# Patient Record
Sex: Female | Born: 1960 | Race: Black or African American | Hispanic: No | Marital: Single
Health system: Southern US, Community
[De-identification: ages and names within clinical notes are randomized; demographics above are authoritative.]

---

## 2005-04-01 ENCOUNTER — Emergency Department: Payer: Self-pay | Admitting: Emergency Medicine

## 2005-06-23 ENCOUNTER — Emergency Department: Payer: Self-pay | Admitting: Emergency Medicine

## 2005-09-06 ENCOUNTER — Emergency Department: Payer: Self-pay | Admitting: Emergency Medicine

## 2005-09-07 ENCOUNTER — Ambulatory Visit: Payer: Self-pay | Admitting: Emergency Medicine

## 2005-09-09 ENCOUNTER — Ambulatory Visit: Payer: Self-pay | Admitting: Internal Medicine

## 2005-10-17 ENCOUNTER — Other Ambulatory Visit: Payer: Self-pay

## 2005-10-17 ENCOUNTER — Emergency Department: Payer: Self-pay | Admitting: General Practice

## 2006-06-01 ENCOUNTER — Emergency Department: Payer: Self-pay | Admitting: Emergency Medicine

## 2007-03-20 ENCOUNTER — Emergency Department: Payer: Self-pay | Admitting: Emergency Medicine

## 2008-01-10 ENCOUNTER — Emergency Department: Payer: Self-pay | Admitting: Emergency Medicine

## 2008-01-11 ENCOUNTER — Emergency Department: Payer: Self-pay

## 2008-01-12 ENCOUNTER — Emergency Department: Payer: Self-pay | Admitting: Emergency Medicine

## 2008-01-13 ENCOUNTER — Emergency Department: Payer: Self-pay | Admitting: Emergency Medicine

## 2008-03-04 ENCOUNTER — Emergency Department: Payer: Self-pay | Admitting: Emergency Medicine

## 2008-03-18 ENCOUNTER — Inpatient Hospital Stay: Payer: Self-pay | Admitting: Internal Medicine

## 2008-03-18 IMAGING — CR DG HUMERUS 2V *R*
1 series · 3 of 3 positions shown · non-contrast
Comparison: none

REASON FOR EXAM: Pain
COMMENTS:

PROCEDURE:     DXR - DXR HUMERUS RIGHT  - [DATE] [DATE]
RESULT:     There is no evidence of fracture, dislocation or malalignment.
There is no evidence of cortical disruption or subcutaneous emphysema to
suggest the sequelae of osteomyelitis.

[Series 1: view not recorded · 0.17mm/px · 3 of 3 slices shown]
[im 1/3]
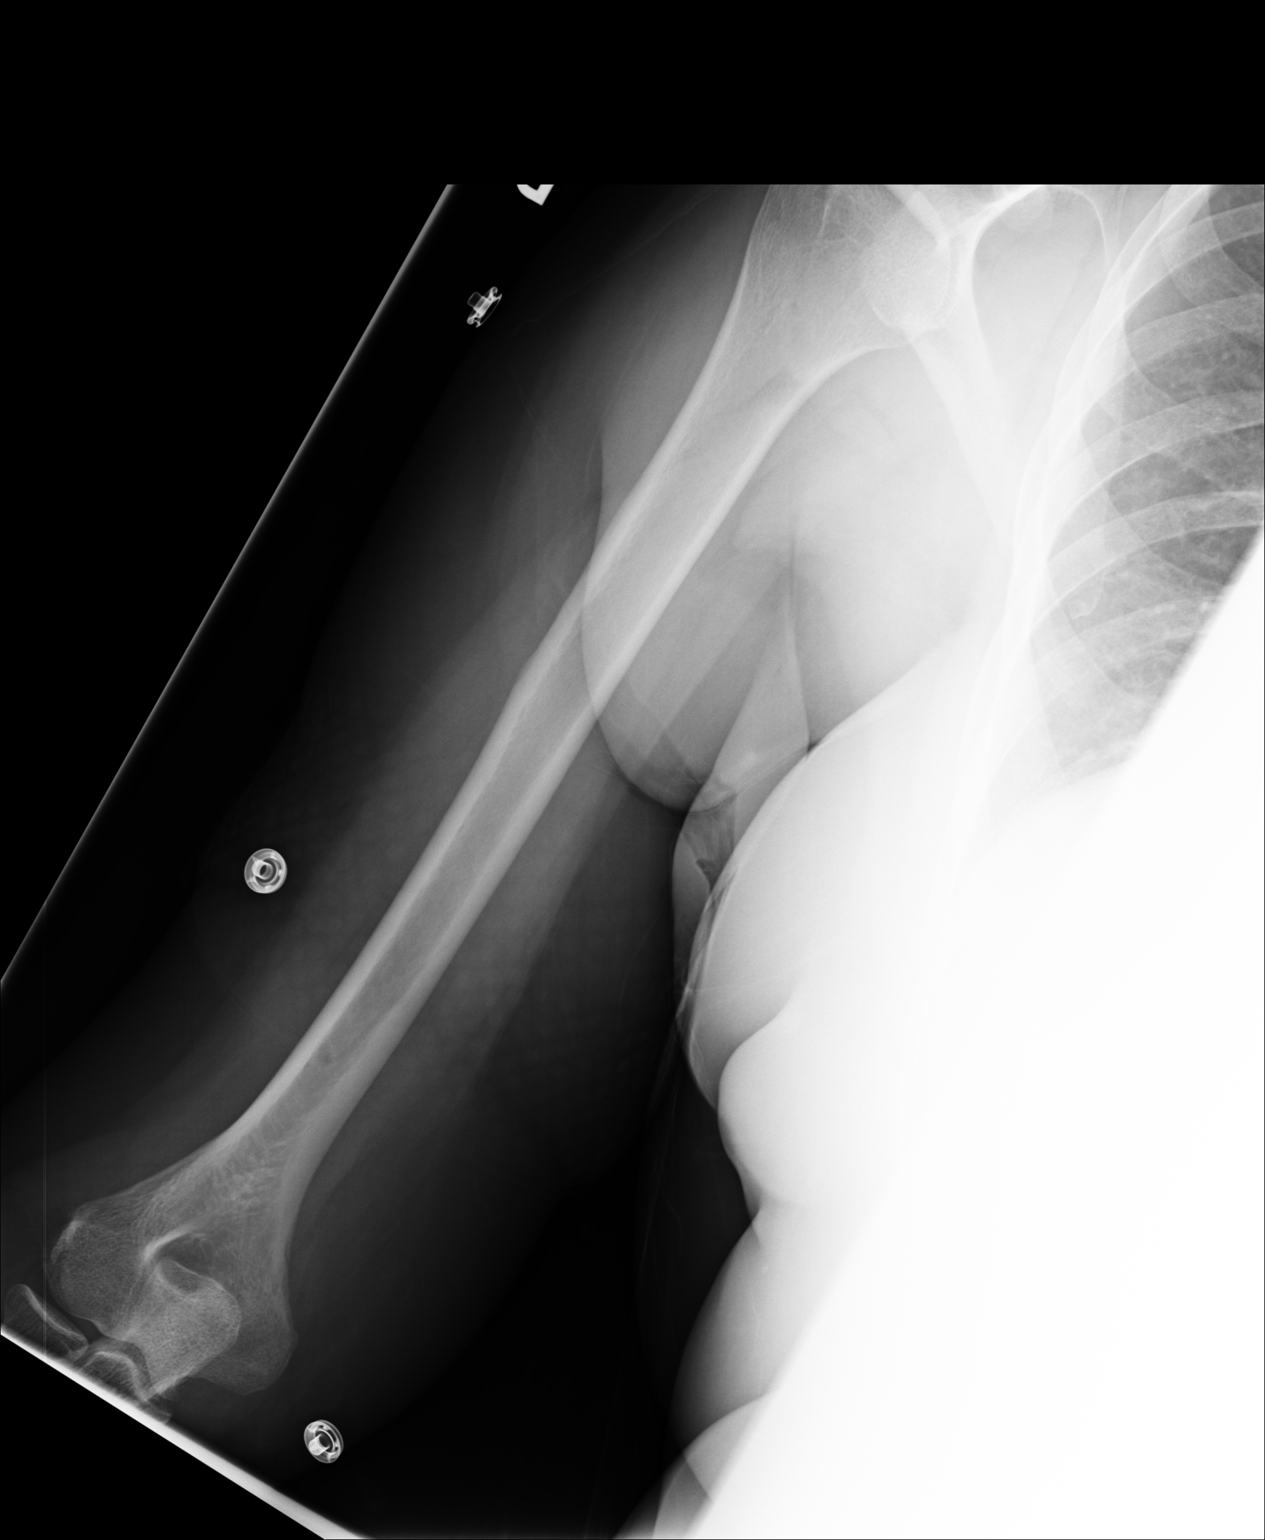
[im 2/3]
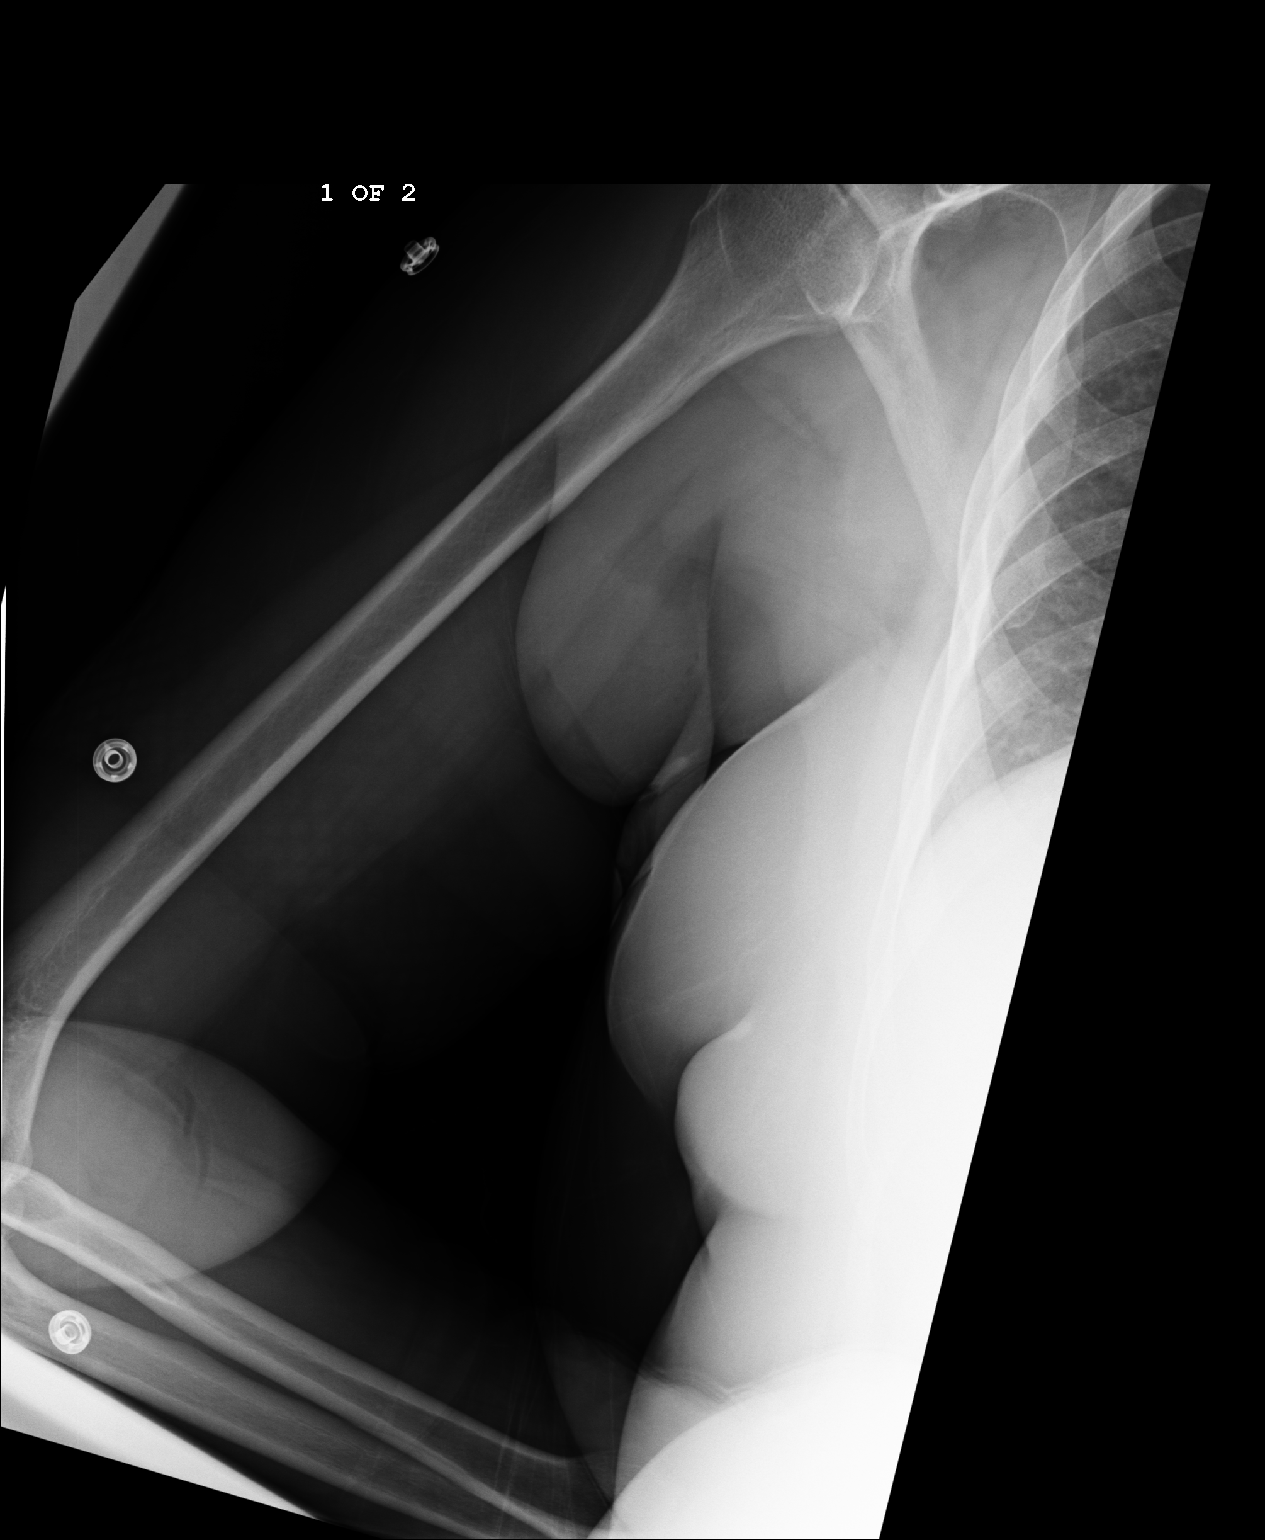
[im 3/3]
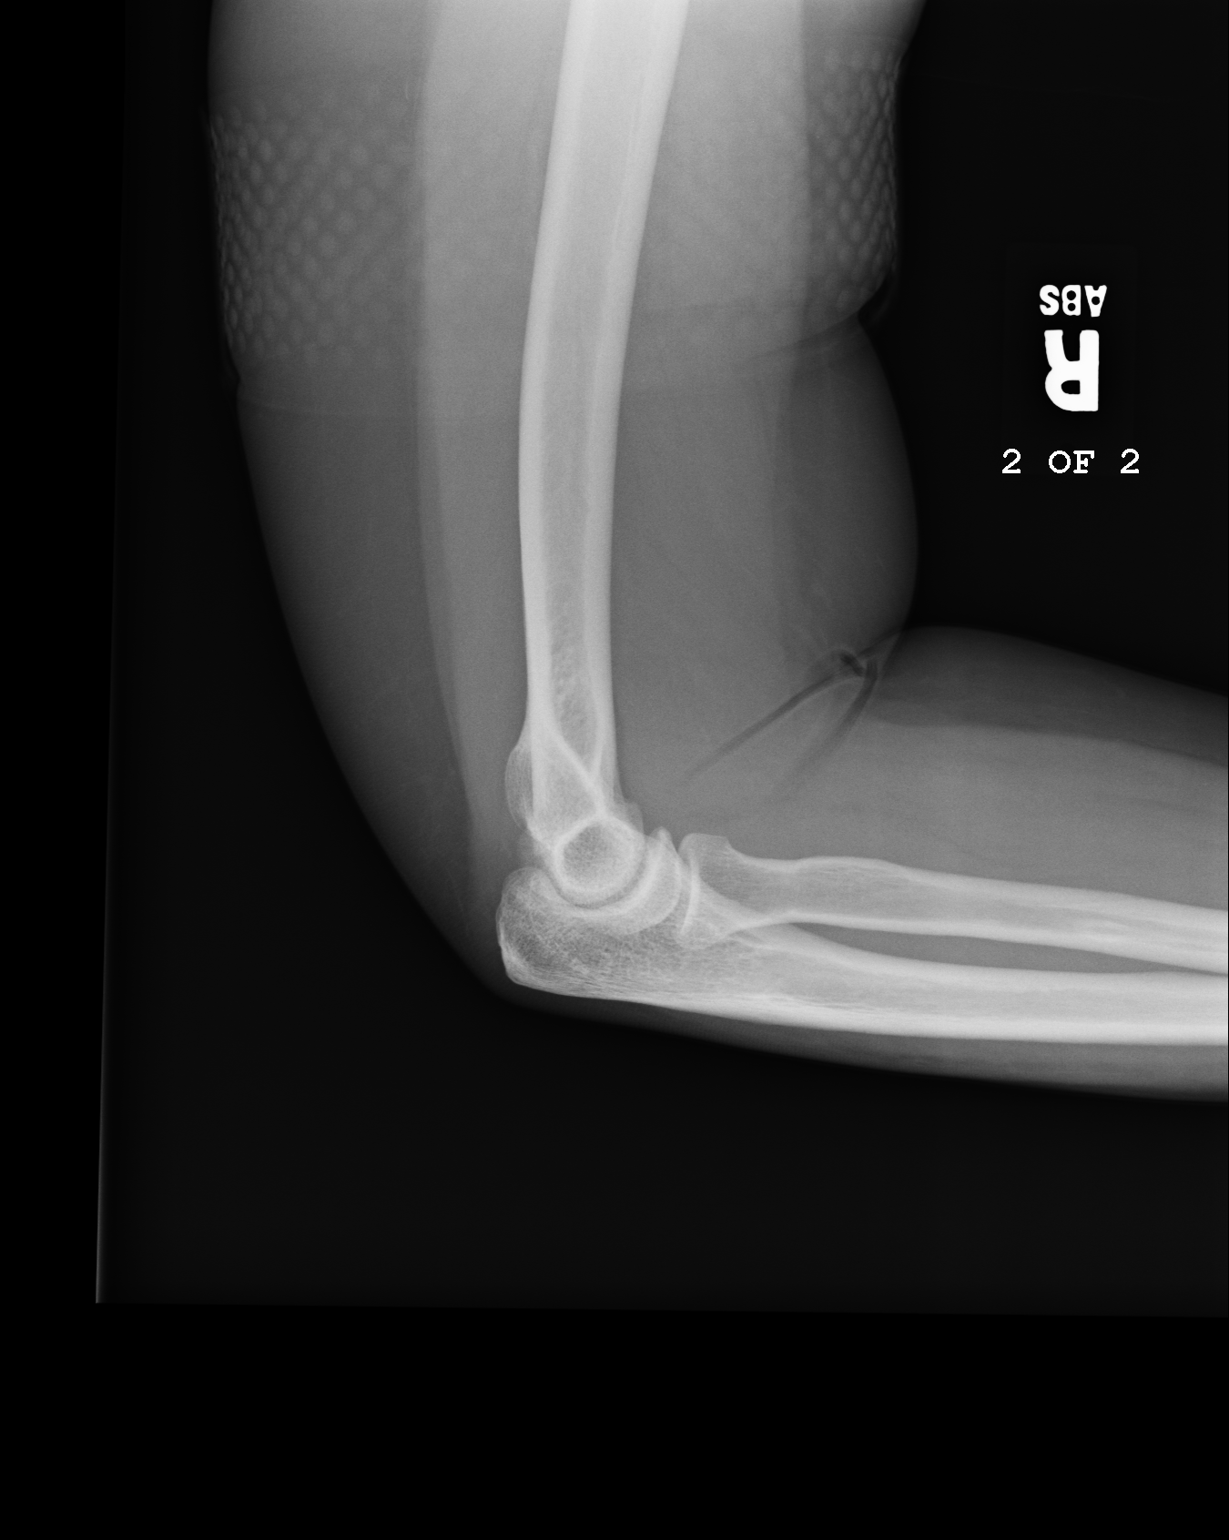

[3 of 3 positions shown; findings below may reference images not displayed]

IMPRESSION: No evidence of focal or acute osseous abnormalities. If
there is persistent clinical concern, repeat evaluation in 7-10 days is
recommended, if clinically warranted.

## 2009-06-22 ENCOUNTER — Ambulatory Visit: Payer: Self-pay | Admitting: Family

## 2009-07-25 ENCOUNTER — Encounter: Payer: Self-pay | Admitting: Orthopedic Surgery

## 2009-08-11 ENCOUNTER — Ambulatory Visit: Payer: Self-pay | Admitting: Pain Medicine

## 2009-08-21 ENCOUNTER — Encounter: Payer: Self-pay | Admitting: Orthopedic Surgery

## 2009-08-22 ENCOUNTER — Ambulatory Visit: Payer: Self-pay | Admitting: Pain Medicine

## 2009-09-15 ENCOUNTER — Ambulatory Visit: Payer: Self-pay | Admitting: Pain Medicine

## 2009-09-21 ENCOUNTER — Ambulatory Visit: Payer: Self-pay | Admitting: Pain Medicine

## 2009-10-20 ENCOUNTER — Ambulatory Visit: Payer: Self-pay | Admitting: Pain Medicine

## 2009-11-28 ENCOUNTER — Ambulatory Visit: Payer: Self-pay | Admitting: Pain Medicine

## 2009-12-15 ENCOUNTER — Ambulatory Visit: Payer: Self-pay | Admitting: Pain Medicine

## 2010-01-16 ENCOUNTER — Ambulatory Visit: Payer: Self-pay | Admitting: Pain Medicine

## 2010-02-16 ENCOUNTER — Ambulatory Visit: Payer: Self-pay | Admitting: Pain Medicine

## 2010-02-20 ENCOUNTER — Ambulatory Visit: Payer: Self-pay | Admitting: Pain Medicine

## 2010-02-25 ENCOUNTER — Emergency Department: Payer: Self-pay | Admitting: Emergency Medicine

## 2010-03-14 ENCOUNTER — Ambulatory Visit: Payer: Self-pay | Admitting: Pain Medicine

## 2010-04-13 ENCOUNTER — Emergency Department: Payer: Self-pay | Admitting: Emergency Medicine

## 2010-04-13 ENCOUNTER — Ambulatory Visit: Payer: Self-pay | Admitting: Pain Medicine

## 2010-06-27 ENCOUNTER — Ambulatory Visit: Payer: Self-pay | Admitting: Family

## 2010-07-13 ENCOUNTER — Ambulatory Visit: Payer: Self-pay | Admitting: Family

## 2011-01-11 ENCOUNTER — Emergency Department: Payer: Self-pay | Admitting: Emergency Medicine

## 2011-01-13 ENCOUNTER — Emergency Department: Payer: Self-pay | Admitting: Emergency Medicine

## 2011-01-15 ENCOUNTER — Emergency Department: Payer: Self-pay | Admitting: Emergency Medicine

## 2011-01-21 ENCOUNTER — Emergency Department: Payer: Self-pay | Admitting: *Deleted

## 2011-02-19 IMAGING — CR DG LUMBAR SPINE 2-3V
1 series · 3 of 3 positions shown · non-contrast
Comparison: none

REASON FOR EXAM: low back pain numbness to left leg
COMMENTS:

[Series 1: view not recorded · 0.17mm/px · 3 of 3 slices shown]
[im 1/3]
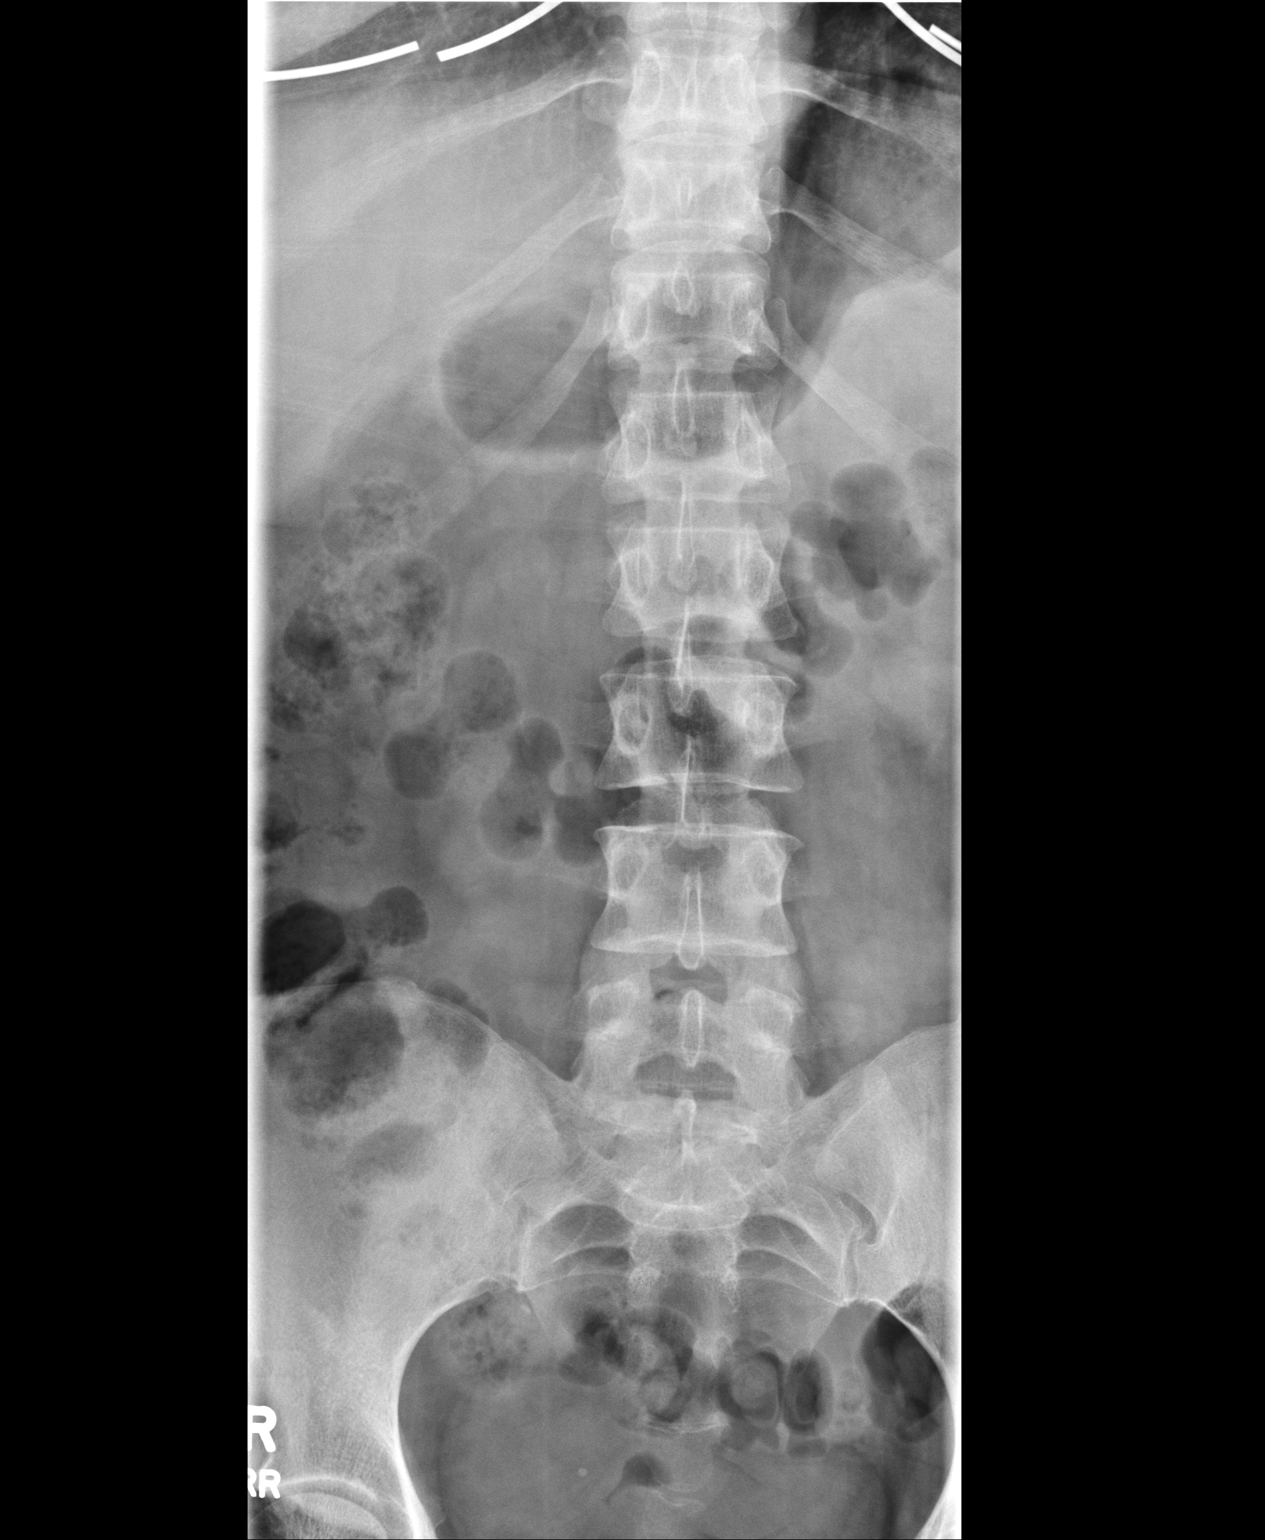
[im 2/3]
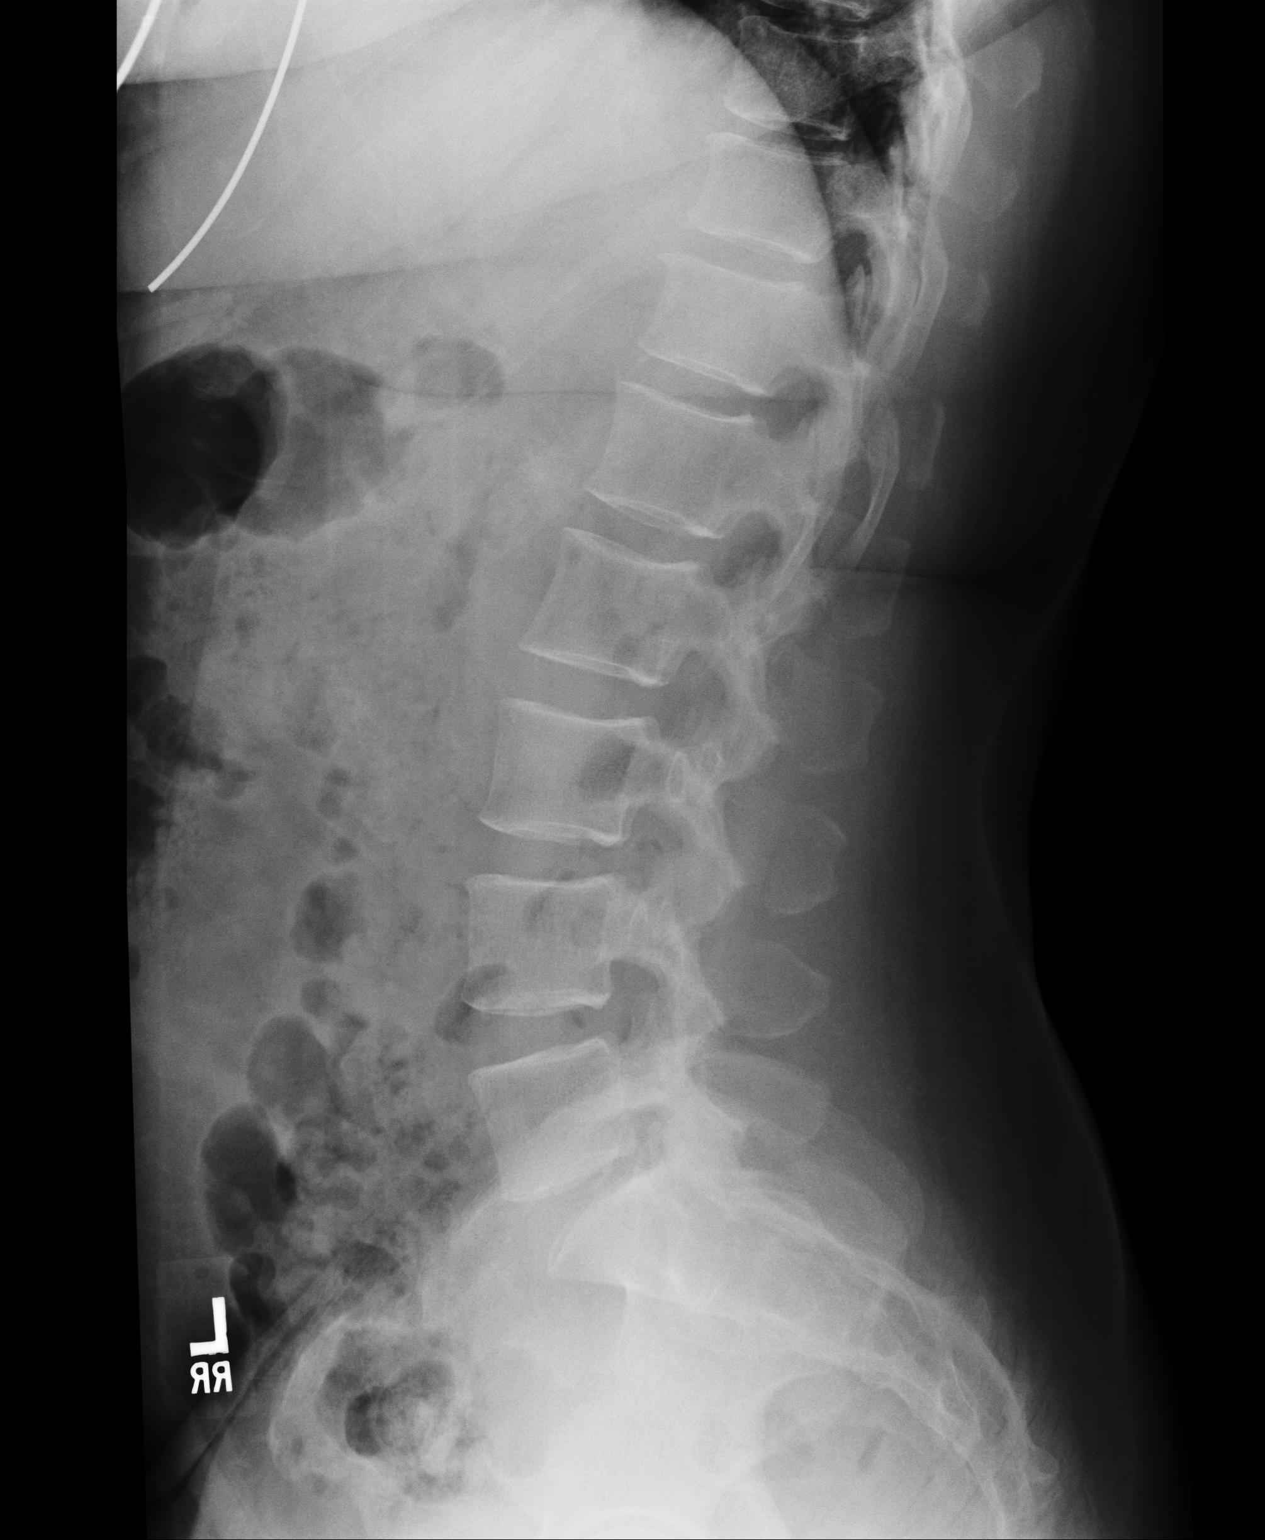
[im 3/3]
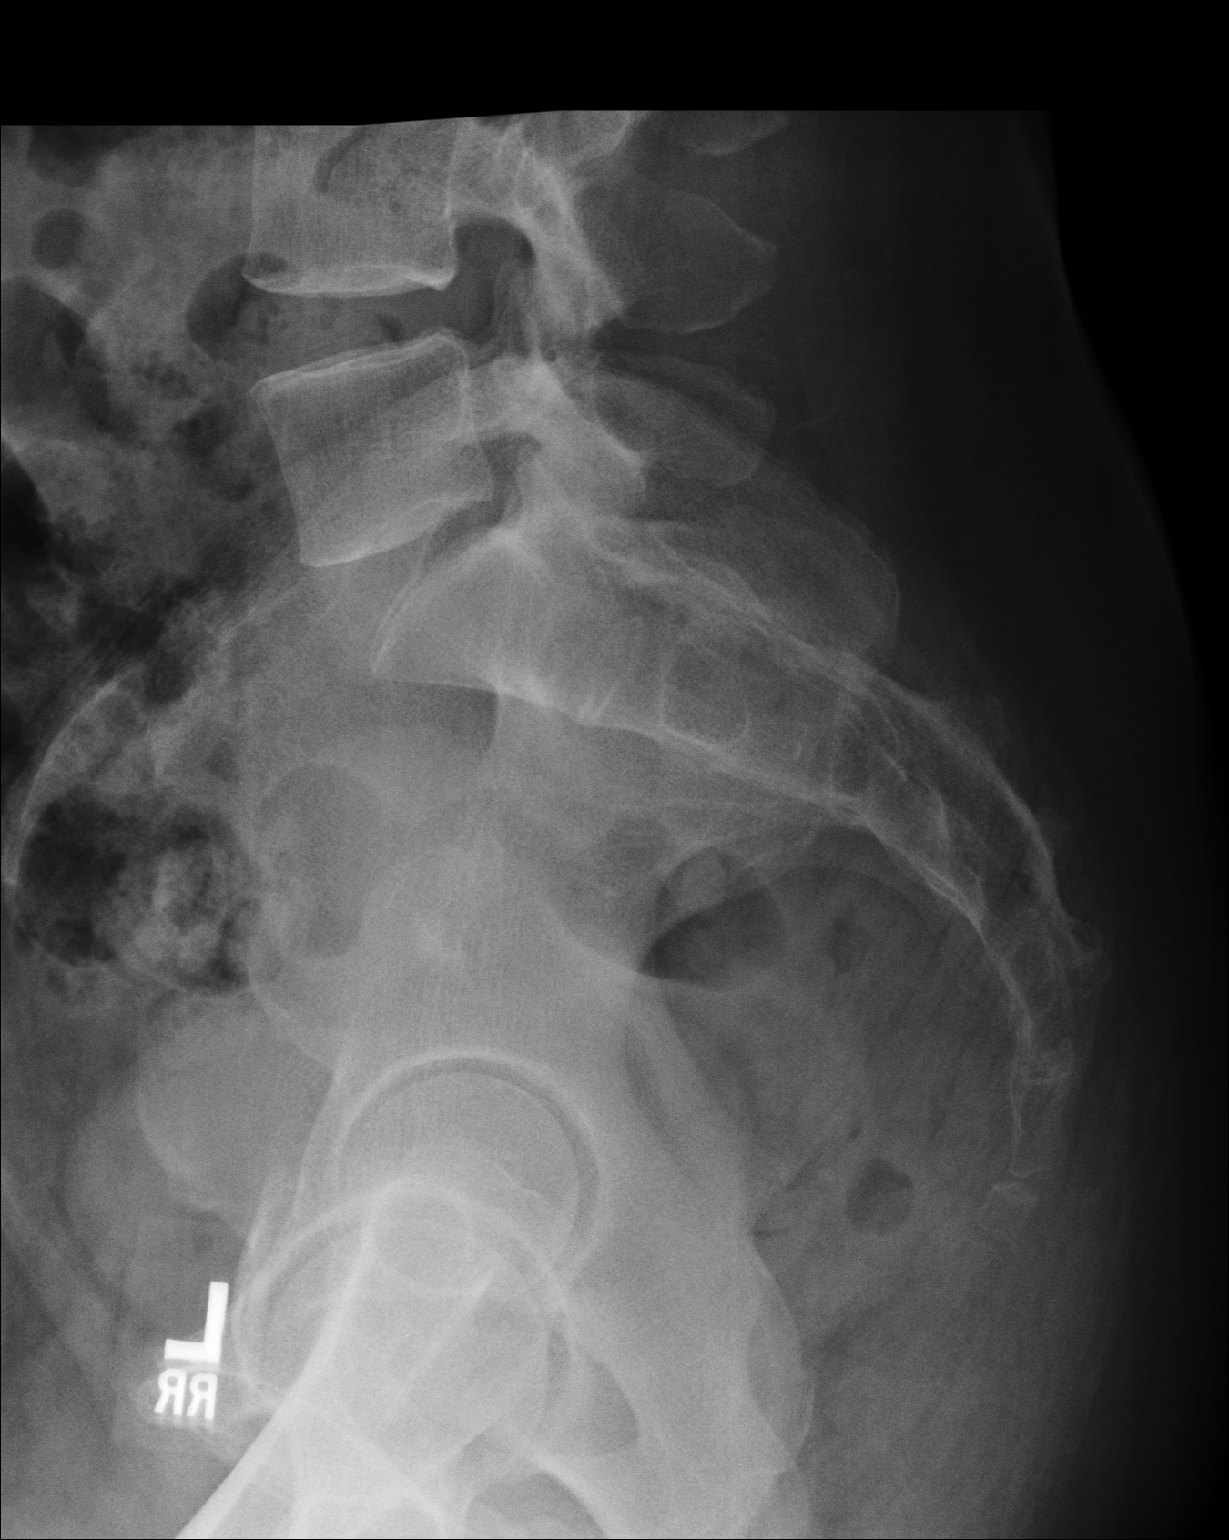

[3 of 3 positions shown; findings below may reference images not displayed]

PROCEDURE:     DXR - DXR LUMBAR SPINE AP AND LATERAL  - February 25, 2010  [DATE]

RESULT:     The lumbar vertebral bodies are preserved in height. The
intravertebral disc space heights are well-maintained. The spinous processes
appear intact. The pedicles and transverse processes are normal in
appearance.
IMPRESSION: I do not see evidence of disc space narrowing or other
degenerative change of the lumbar spine. There is no evidence of a
compression fracture.

## 2022-11-16 ENCOUNTER — Other Ambulatory Visit: Payer: Self-pay

## 2022-11-16 ENCOUNTER — Emergency Department
Admission: EM | Admit: 2022-11-16 | Discharge: 2022-11-16 | Disposition: A | Discharge status: Home / Self Care | Payer: Medicare Other | Attending: Student in an Organized Health Care Education/Training Program | Admitting: Student in an Organized Health Care Education/Training Program

## 2022-11-16 ENCOUNTER — Emergency Department: Payer: Medicare Other

## 2022-11-16 DIAGNOSIS — J45909 Unspecified asthma, uncomplicated: Secondary | ICD-10-CM | POA: Diagnosis not present

## 2022-11-16 DIAGNOSIS — I10 Essential (primary) hypertension: Secondary | ICD-10-CM | POA: Insufficient documentation

## 2022-11-16 DIAGNOSIS — E119 Type 2 diabetes mellitus without complications: Secondary | ICD-10-CM | POA: Diagnosis not present

## 2022-11-16 DIAGNOSIS — D72829 Elevated white blood cell count, unspecified: Secondary | ICD-10-CM | POA: Insufficient documentation

## 2022-11-16 DIAGNOSIS — Z1152 Encounter for screening for COVID-19: Secondary | ICD-10-CM | POA: Diagnosis not present

## 2022-11-16 DIAGNOSIS — R112 Nausea with vomiting, unspecified: Secondary | ICD-10-CM | POA: Diagnosis present

## 2022-11-16 DIAGNOSIS — R197 Diarrhea, unspecified: Secondary | ICD-10-CM | POA: Diagnosis not present

## 2022-11-16 DIAGNOSIS — R1084 Generalized abdominal pain: Secondary | ICD-10-CM

## 2022-11-16 DIAGNOSIS — R109 Unspecified abdominal pain: Secondary | ICD-10-CM | POA: Diagnosis not present

## 2022-11-16 LAB — URINALYSIS, W/ REFLEX TO CULTURE (INFECTION SUSPECTED)
Bilirubin Urine: NEGATIVE
Glucose, UA: NEGATIVE mg/dL
Hgb urine dipstick: NEGATIVE
Ketones, ur: 5 mg/dL — AB
Leukocytes,Ua: NEGATIVE
Nitrite: NEGATIVE
Protein, ur: NEGATIVE mg/dL
Specific Gravity, Urine: >1.046 — ABNORMAL HIGH (ref 1.005–1.030)
pH: 6 (ref 5.0–8.0)

## 2022-11-16 LAB — COMPREHENSIVE METABOLIC PANEL
ALT: 29 U/L (ref 0–44)
AST: 26 U/L (ref 15–41)
Albumin: 4.3 g/dL (ref 3.5–5.0)
Alkaline Phosphatase: 92 U/L (ref 38–126)
Anion gap: 13 (ref 5–15)
BUN: 12 mg/dL (ref 8–23)
CO2: 25 mmol/L (ref 22–32)
Calcium: 10.5 mg/dL — ABNORMAL HIGH (ref 8.9–10.3)
Chloride: 103 mmol/L (ref 98–111)
Creatinine, Ser: 0.86 mg/dL (ref 0.44–1.00)
GFR, Estimated: >60 mL/min (ref >60)
Glucose, Bld: 99 mg/dL (ref 70–99)
Potassium: 3.8 mmol/L (ref 3.5–5.1)
Sodium: 141 mmol/L (ref 135–145)
Total Bilirubin: 0.2 mg/dL — ABNORMAL LOW (ref 0.3–1.2)
Total Protein: 8.4 g/dL — ABNORMAL HIGH (ref 6.5–8.1)

## 2022-11-16 LAB — CBC WITH DIFFERENTIAL/PLATELET
Abs Immature Granulocytes: 0.03 10*3/uL (ref 0.00–0.07)
Basophils Absolute: 0.1 10*3/uL (ref 0.0–0.1)
Basophils Relative: 1 %
Eosinophils Absolute: 0.2 10*3/uL (ref 0.0–0.5)
Eosinophils Relative: 1 %
HCT: 39.2 % (ref 36.0–46.0)
Hemoglobin: 13.7 g/dL (ref 12.0–15.0)
Immature Granulocytes: 0 %
Lymphocytes Relative: 28 %
Lymphs Abs: 3.5 10*3/uL (ref 0.7–4.0)
MCH: 31.4 pg (ref 26.0–34.0)
MCHC: 34.9 g/dL (ref 30.0–36.0)
MCV: 89.9 fL (ref 80.0–100.0)
Monocytes Absolute: 0.7 10*3/uL (ref 0.1–1.0)
Monocytes Relative: 6 %
Neutro Abs: 7.8 10*3/uL — ABNORMAL HIGH (ref 1.7–7.7)
Neutrophils Relative %: 64 %
Platelets: 420 10*3/uL — ABNORMAL HIGH (ref 150–400)
RBC: 4.36 MIL/uL (ref 3.87–5.11)
RDW: 13 % (ref 11.5–15.5)
WBC: 12.4 10*3/uL — ABNORMAL HIGH (ref 4.0–10.5)
nRBC: 0 % (ref 0.0–0.2)

## 2022-11-16 LAB — RESP PANEL BY RT-PCR (RSV, FLU A&B, COVID)  RVPGX2
Influenza A by PCR: NEGATIVE
Influenza B by PCR: NEGATIVE
Resp Syncytial Virus by PCR: NEGATIVE
SARS Coronavirus 2 by RT PCR: NEGATIVE

## 2022-11-16 LAB — LACTIC ACID, PLASMA: Lactic Acid, Venous: 1.3 mmol/L (ref 0.5–1.9)

## 2022-11-16 LAB — LIPASE, BLOOD: Lipase: 34 U/L (ref 11–51)

## 2022-11-16 MED ORDER — ONDANSETRON HCL 4 MG/2ML IJ SOLN
4.0000 mg | Freq: Once | INTRAMUSCULAR | Status: AC
  Administered 2022-11-16: 4 mg via INTRAVENOUS
  Filled 2022-11-16: qty 2

## 2022-11-16 MED ORDER — SODIUM CHLORIDE 0.9 % IV BOLUS
1000.0000 mL | Freq: Once | INTRAVENOUS | Status: AC
  Administered 2022-11-16: 1000 mL via INTRAVENOUS

## 2022-11-16 MED ORDER — ONDANSETRON 4 MG PO TBDP: 4.0000 mg | ORAL_TABLET | Freq: Three times a day (TID) | ORAL | 0 refills | Status: AC | PRN

## 2022-11-16 MED ORDER — ONDANSETRON 4 MG PO TBDP: 4.0000 mg | ORAL_TABLET | Freq: Three times a day (TID) | ORAL | 0 refills | Status: DC | PRN

## 2022-11-16 MED ORDER — DICYCLOMINE HCL 10 MG PO CAPS: 10.0000 mg | ORAL_CAPSULE | Freq: Three times a day (TID) | ORAL | 0 refills | Status: AC | PRN

## 2022-11-16 MED ORDER — MORPHINE SULFATE (PF) 4 MG/ML IV SOLN
4.0000 mg | Freq: Once | INTRAVENOUS | Status: AC
  Administered 2022-11-16: 4 mg via INTRAVENOUS
  Filled 2022-11-16: qty 1

## 2022-11-16 MED ORDER — METOCLOPRAMIDE HCL 5 MG/ML IJ SOLN
5.0000 mg | Freq: Once | INTRAMUSCULAR | Status: AC
  Administered 2022-11-16: 5 mg via INTRAVENOUS
  Filled 2022-11-16: qty 2

## 2022-11-16 MED ORDER — IOHEXOL 300 MG/ML  SOLN
100.0000 mL | Freq: Once | INTRAMUSCULAR | Status: AC | PRN
  Administered 2022-11-16: 100 mL via INTRAVENOUS

## 2022-11-16 MED ORDER — DROPERIDOL 2.5 MG/ML IJ SOLN
2.5000 mg | Freq: Once | INTRAMUSCULAR | Status: AC
  Administered 2022-11-16: 2.5 mg via INTRAVENOUS
  Filled 2022-11-16: qty 2

## 2022-11-16 NOTE — ED Provider Notes (Signed)
Patient felt better after medication. CT without concerning acute finding in the abdomen or pelvis. Did discuss pulmonary nodule with patient. Apparently she now lives in [place] so will follow up with doctor there. At this time will treat for gastroenteritis.    Phineas Semen, MD 11/16/22 [phone]

## 2022-11-16 NOTE — ED Triage Notes (Signed)
Pt reports coming back from [place] 3 days ago and having n/v and diarrhea since she returned home.

## 2022-11-16 NOTE — ED Notes (Signed)
Unable to obtain PIV access x 2. Second RN at bedside.

## 2022-11-16 NOTE — ED Provider Notes (Signed)
[facility] Provider Note    Event Date/Time   First MD Initiated Contact with Patient 11/16/22 1249     (approximate)   History   Abdominal Pain   HPI  Anita Galvan is a 62 y.o. female past medical history significant for diabetes, hypertension, hyperlipidemia, obesity, asthma, who presents to the emergency department for not feeling well.  States that she is in town visiting her granddaughter from [place].  As soon as she got off the plane states that she has not been feeling well.  Multiple episodes of nausea, vomiting abdominal cramping and diarrhea.  3 episodes of diarrhea today that was nonbloody.  Endorses decreased urination.  Denies any chest pain or shortness of breath.  Mild cough.  Denies any headache or change in vision.  No falls or head trauma.  Recently slowly increasing up her Ozempic.  No history of DVT or PE.  No leg swelling.     Physical Exam   Triage Vital Signs: ED Triage Vitals  Encounter Vitals Group     BP      Systolic BP Percentile      Diastolic BP Percentile      Pulse      Resp      Temp      Temp src      SpO2      Weight      Height      Head Circumference      Peak Flow      Pain Score      Pain Loc      Pain Education      Exclude from Growth Chart     Most recent vital signs: Vitals:   11/16/22 1252  BP: (!) 130/100  Pulse: 87  Resp: 16  Temp: 98.7 F (37.1 C)  SpO2: 99%    Physical Exam Constitutional:      Appearance: She is well-developed.  HENT:     Head: Atraumatic.  Eyes:     Conjunctiva/sclera: Conjunctivae normal.  Cardiovascular:     Rate and Rhythm: Regular rhythm.  Pulmonary:     Effort: No respiratory distress.  Abdominal:     General: There is no distension.     Tenderness: There is abdominal tenderness (mild diffuse ttp).  Musculoskeletal:        General: Normal range of motion.     Cervical back: Normal range of motion.     Right lower leg: No edema.     Left  lower leg: No edema.  Skin:    General: Skin is warm.     Capillary Refill: Capillary refill takes less than 2 seconds.  Neurological:     Mental Status: She is alert. Mental status is at baseline.  Psychiatric:        Mood and Affect: Mood normal.     IMPRESSION / MDM / ASSESSMENT AND PLAN / ED COURSE  I reviewed the triage vital signs and the nursing notes.  Differential diagnosis including dehydration, viral gastroenteritis, COVID, pancreatitis, hyperglycemia, medication side effect from Ozempic  EKG  I, Corena Herter, the attending physician, personally viewed and interpreted this ECG.   Rate: Normal  Rhythm: Normal sinus  Axis: Normal  Intervals: Normal  ST&T Change: None  No tachycardic or bradycardic dysrhythmias while on cardiac telemetry. LABS (all labs ordered are listed, but only abnormal results are displayed) Labs interpreted as -    Labs Reviewed  COMPREHENSIVE METABOLIC PANEL -  Abnormal; Notable for the following components:      Result Value   Calcium 10.5 (*)    Total Protein 8.4 (*)    Total Bilirubin 0.2 (*)    All other components within normal limits  CBC WITH DIFFERENTIAL/PLATELET - Abnormal; Notable for the following components:   WBC 12.4 (*)    Platelets 420 (*)    Neutro Abs 7.8 (*)    All other components within normal limits  RESP PANEL BY RT-PCR (RSV, FLU A&B, COVID)  [id number]  LIPASE, BLOOD  LACTIC ACID, PLASMA  URINALYSIS, W/ REFLEX TO CULTURE (INFECTION SUSPECTED)     MDM  Patient given 1 L of IV fluids and IV antiemetics with Zofran  Patient with leukocytosis.  Creatinine at baseline with no significant electrolyte abnormalities.  COVID testing is negative.  No signs of urinary tract infection.  On reevaluation continues to complain of nausea and not feeling well.  Worsening abdominal pain.  Given another dose of IV antiemetics, morphine  Continues to have ongoing nausea, given IV droperidol CT currently pending.  Care  transferred to incoming physician.  Plan to wait on CT scan, if CT scan is reassuring and able to tolerate p.o. plan to discharge home with antiemetics for viral gastroenteritis.     PROCEDURES:  Critical Care performed: No  Procedures  Patient's presentation is most consistent with acute presentation with potential threat to life or bodily function.   MEDICATIONS ORDERED IN ED: Medications  droperidol (INAPSINE) 2.5 MG/ML injection 2.5 mg (has no administration in time range)  sodium chloride 0.9 % bolus 1,000 mL (0 mLs Intravenous Stopped 11/16/22 1345)  ondansetron (ZOFRAN) injection 4 mg (4 mg Intravenous Given 11/16/22 1345)  sodium chloride 0.9 % bolus 1,000 mL (1,000 mLs Intravenous New Bag/Given 11/16/22 1433)  morphine (PF) 4 MG/ML injection 4 mg (4 mg Intravenous Given 11/16/22 1432)  metoCLOPramide (REGLAN) injection 5 mg (5 mg Intravenous Given 11/16/22 1433)    FINAL CLINICAL IMPRESSION(S) / ED DIAGNOSES   Final diagnoses:  None     Rx / DC Orders   ED Discharge Orders     None        Note:  This document was prepared using Dragon voice recognition software and may include unintentional dictation errors.   Corena Herter, MD 11/16/22 [phone]

## 2022-11-16 NOTE — ED Notes (Signed)
PT assisted to bathroom and back to bed. Pt asking if she is going home soon; states "I'm restless and want to go.I'm feeling better." Urine sent and provider notified.

## 2022-11-16 NOTE — ED Notes (Signed)
Pt stating her pain is coming back and is dry heaving. No meds available at this time. Emesis bag provided. Provider notified.

## 2022-11-16 NOTE — Discharge Instructions (Addendum)
Please discuss follow up with your primary care for the 3 mm left lower lobe pulmonary nodule seen on CT today. Please seek medical attention for any high fevers, chest pain, shortness of breath, change in behavior, persistent vomiting, bloody stool or any other new or concerning symptoms.
